# Patient Record
Sex: Male | Born: 1985 | Race: White | Hispanic: No | Marital: Married | Smoking: Never smoker
Health system: Southern US, Community
[De-identification: ages and names within clinical notes are randomized; demographics above are authoritative.]

## PROBLEM LIST (undated history)

## (undated) DIAGNOSIS — M459 Ankylosing spondylitis of unspecified sites in spine: Secondary | ICD-10-CM

## (undated) HISTORY — PX: OTHER SURGICAL HISTORY: SHX169

## (undated) HISTORY — PX: REPAIR OF SYNDACTYLY HAND: SUR1196

---

## 2017-08-25 ENCOUNTER — Encounter: Payer: Self-pay | Admitting: Emergency Medicine

## 2017-08-25 ENCOUNTER — Encounter (INDEPENDENT_AMBULATORY_CARE_PROVIDER_SITE_OTHER): Payer: Self-pay

## 2017-08-25 ENCOUNTER — Other Ambulatory Visit: Payer: Self-pay

## 2017-08-25 ENCOUNTER — Ambulatory Visit
Admission: EM | Admit: 2017-08-25 | Discharge: 2017-08-25 | Disposition: A | Discharge status: Home / Self Care | Payer: BLUE CROSS/BLUE SHIELD | Attending: Family Medicine | Admitting: Family Medicine

## 2017-08-25 ENCOUNTER — Ambulatory Visit (INDEPENDENT_AMBULATORY_CARE_PROVIDER_SITE_OTHER): Payer: BLUE CROSS/BLUE SHIELD

## 2017-08-25 DIAGNOSIS — Y9361 Activity, american tackle football: Secondary | ICD-10-CM | POA: Diagnosis not present

## 2017-08-25 DIAGNOSIS — W19XXXA Unspecified fall, initial encounter: Secondary | ICD-10-CM | POA: Diagnosis not present

## 2017-08-25 DIAGNOSIS — M545 Low back pain: Secondary | ICD-10-CM

## 2017-08-25 DIAGNOSIS — M461 Sacroiliitis, not elsewhere classified: Secondary | ICD-10-CM

## 2017-08-25 DIAGNOSIS — S300XXA Contusion of lower back and pelvis, initial encounter: Secondary | ICD-10-CM | POA: Diagnosis not present

## 2017-08-25 HISTORY — DX: Ankylosing spondylitis of unspecified sites in spine: M45.9

## 2017-08-25 MED ORDER — HYDROCODONE-ACETAMINOPHEN 5-325 MG PO TABS: ORAL_TABLET | ORAL | 0 refills | Status: AC

## 2017-08-25 NOTE — ED Triage Notes (Signed)
Patient c/o falling 2 days ago while playing football and landed on his back. He stated this morning he is having low back pain and tailbone pain.

## 2017-08-25 NOTE — ED Provider Notes (Signed)
[facility]    CSN: [id number] Arrival date & time: 08/25/17  1511     History   Chief Complaint Chief Complaint  Patient presents with  . Back Pain    HPI Worth Chanson Teems is a 32 y.o. male.   32 yo male with a c/o low back and tailbone pain after falling 2 days and landing on his low back and sacrum while playing football. Denies any fevers, chills, numbness/tingling.   The history is provided by the patient.  Back Pain    Past Medical History:  Diagnosis Date  . Ankylosing spondylitis (HCC)     There are no active problems to display for this patient.   Past Surgical History:  Procedure Laterality Date  . leg amputated    . REPAIR OF SYNDACTYLY HAND         Home Medications    Prior to Admission medications   Medication Sig Start Date End Date Taking? Authorizing Provider  celecoxib (CELEBREX) 100 MG capsule Take 100 mg by mouth 2 (two) times daily.   Yes [provider]  HYDROcodone-acetaminophen (NORCO/VICODIN) 5-325 MG tablet 1-2 tabs po qd prn 08/25/17   Payton Mccallum, MD    Family History Family History  Problem Relation Age of Onset  . Diabetes Mother   . Hypertension Mother   . Healthy Father     Social History Social History   Tobacco Use  . Smoking status: Never Smoker  . Smokeless tobacco: Never Used  Substance Use Topics  . Alcohol use: Yes    Comment: socially  . Drug use: Never     Allergies   Patient has no known allergies.   Review of Systems Review of Systems  Musculoskeletal: Positive for back pain.     Physical Exam Triage Vital Signs ED Triage Vitals  Enc Vitals Group     BP 08/25/17 1531 123/79     Pulse Rate 08/25/17 1531 77     Resp 08/25/17 1531 16     Temp 08/25/17 1531 98 F (36.7 C)     Temp Source 08/25/17 1531 Oral     SpO2 08/25/17 1531 98 %     Weight 08/25/17 1528 240 lb (108.9 kg)     Height 08/25/17 1528 5\' 5"  (1.651 m)     Head Circumference --      Peak Flow  --      Pain Score 08/25/17 1528 8     Pain Loc --      Pain Edu? --      Excl. in GC? --    No data found.  Updated Vital Signs BP 123/79 (BP Location: Left Arm)   Pulse 77   Temp 98 F (36.7 C) (Oral)   Resp 16   Ht 5\' 5"  (1.651 m)   Wt 240 lb (108.9 kg)   SpO2 98%   BMI 39.94 kg/m   Visual Acuity Right Eye Distance:   Left Eye Distance:   Bilateral Distance:    Right Eye Near:   Left Eye Near:    Bilateral Near:     Physical Exam  Constitutional: He appears well-developed and well-nourished. No distress.  Neck: Normal range of motion. Neck supple. No tracheal deviation present.  Pulmonary/Chest: Effort normal. No stridor. No respiratory distress.  Musculoskeletal:       Lumbar back: He exhibits tenderness, bony tenderness (lumbar sacral) and spasm. He exhibits normal range of motion, no swelling, no edema, no deformity, no laceration,  no pain and normal pulse.  Neurological: He is alert. He has normal reflexes. He displays normal reflexes. He exhibits normal muscle tone. Coordination normal.  Skin: No rash noted. He is not diaphoretic.  Nursing note and vitals reviewed.    UC Treatments / Results  Labs (all labs ordered are listed, but only abnormal results are displayed) Labs Reviewed - No data to display  EKG None  Radiology Dg Lumbar Spine Complete  Result Date: 08/25/2017 CLINICAL DATA:  Fall 2 days ago.  Low back pain EXAM: LUMBAR SPINE - COMPLETE 4+ VIEW COMPARISON:  None. FINDINGS: There is no evidence of lumbar spine fracture. Alignment is normal. Intervertebral disc spaces are maintained. IMPRESSION: Negative. Electronically Signed   By: Charlett NoseKevin  Dover M.D.   On: 08/25/2017 16:14   Dg Sacrum/coccyx  Result Date: 08/25/2017 CLINICAL DATA:  Fall 2 days ago.  Low back pain EXAM: SACRUM AND COCCYX - 2+ VIEW COMPARISON:  None. FINDINGS: Sclerosis around the SI joints bilaterally compatible with sacroiliitis. No evidence of sacral or coccygeal fracture. Hip  joints are maintained and symmetric. IMPRESSION: Sclerosis around the SI joints compatible with sacroiliitis. No acute bony abnormality. Electronically Signed   By: Charlett NoseKevin  Dover M.D.   On: 08/25/2017 16:14    Procedures Procedures (including critical care time)  Medications Ordered in UC Medications - No data to display  Initial Impression / Assessment and Plan / UC Course  I have reviewed the triage vital signs and the nursing notes.  Pertinent labs & imaging results that were available during my care of the patient were reviewed by me and considered in my medical decision making (see chart for details).      Final Clinical Impressions(s) / UC Diagnoses   Final diagnoses:  Lumbar contusion, initial encounter  Contusion of sacrum, initial encounter   Discharge Instructions   None    ED Prescriptions    Medication Sig Dispense Auth. Provider   HYDROcodone-acetaminophen (NORCO/VICODIN) 5-325 MG tablet 1-2 tabs po qd prn 6 tablet Homar Weinkauf, Pamala Hurryrlando, MD     1. x-ray results and diagnosis reviewed with patient 2. rx as per orders above; reviewed possible side effects, interactions, risks and benefits  3. Recommend supportive treatment with otc analgesics, rest, ice 4. Follow-up prn if symptoms worsen or don't improve  Controlled Substance Prescriptions [place] Controlled Substance Registry consulted? Not Applicable   Payton Mccallumonty, Dayln Tugwell, MD 08/25/17 [phone]

## 2019-04-07 IMAGING — CR DG LUMBAR SPINE COMPLETE 4+V
4 series · 4 of 4 positions shown · non-contrast
Comparison: None.

CLINICAL DATA: Fall 2 days ago.  Low back pain

EXAM:
LUMBAR SPINE - COMPLETE 4+ VIEW

[l-spine ap]
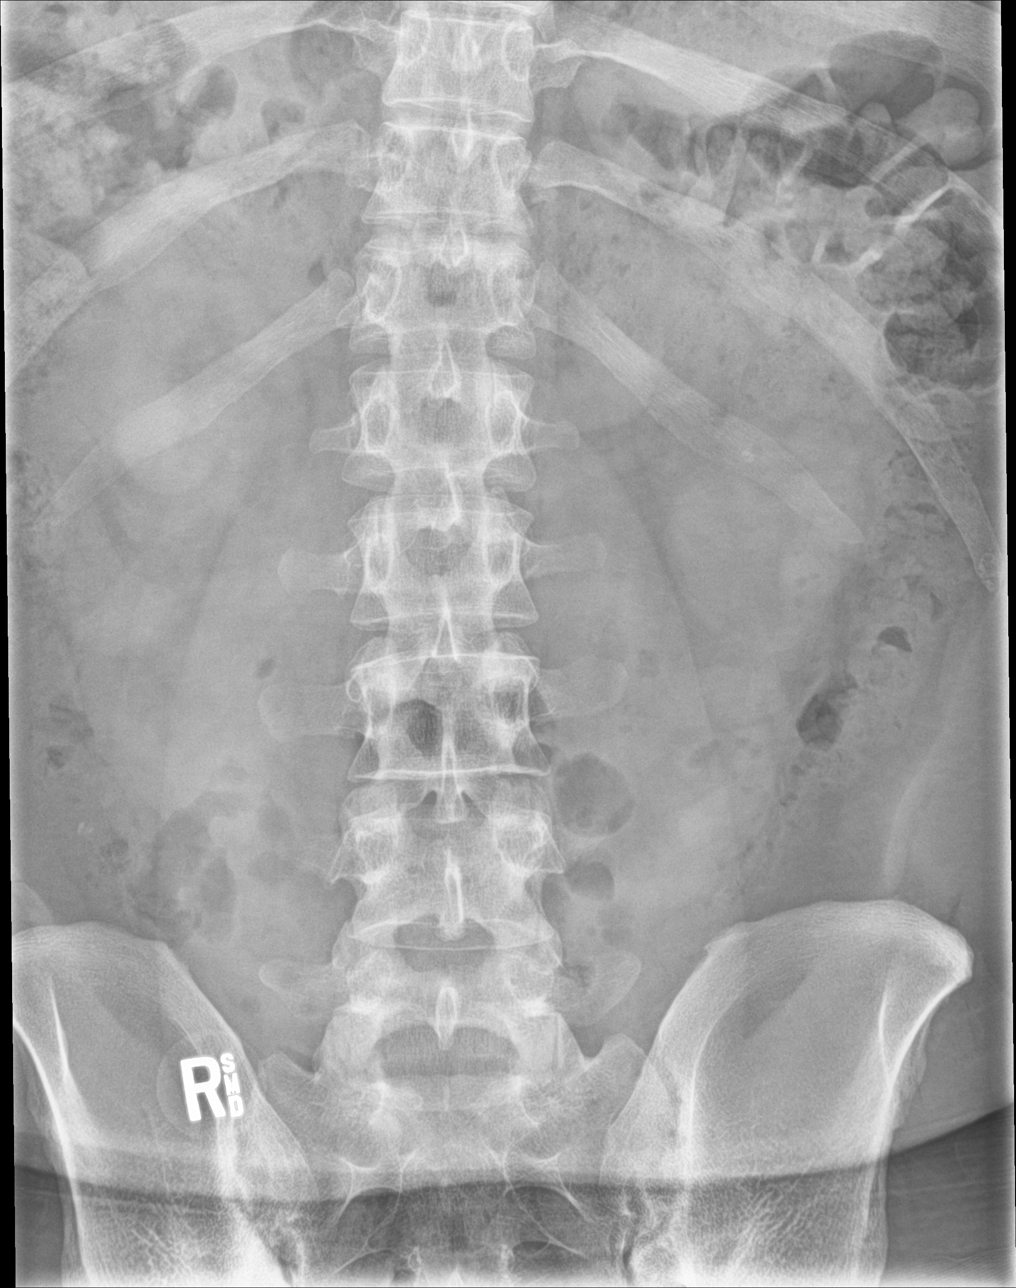

[l-spine obl (1 of 2)]
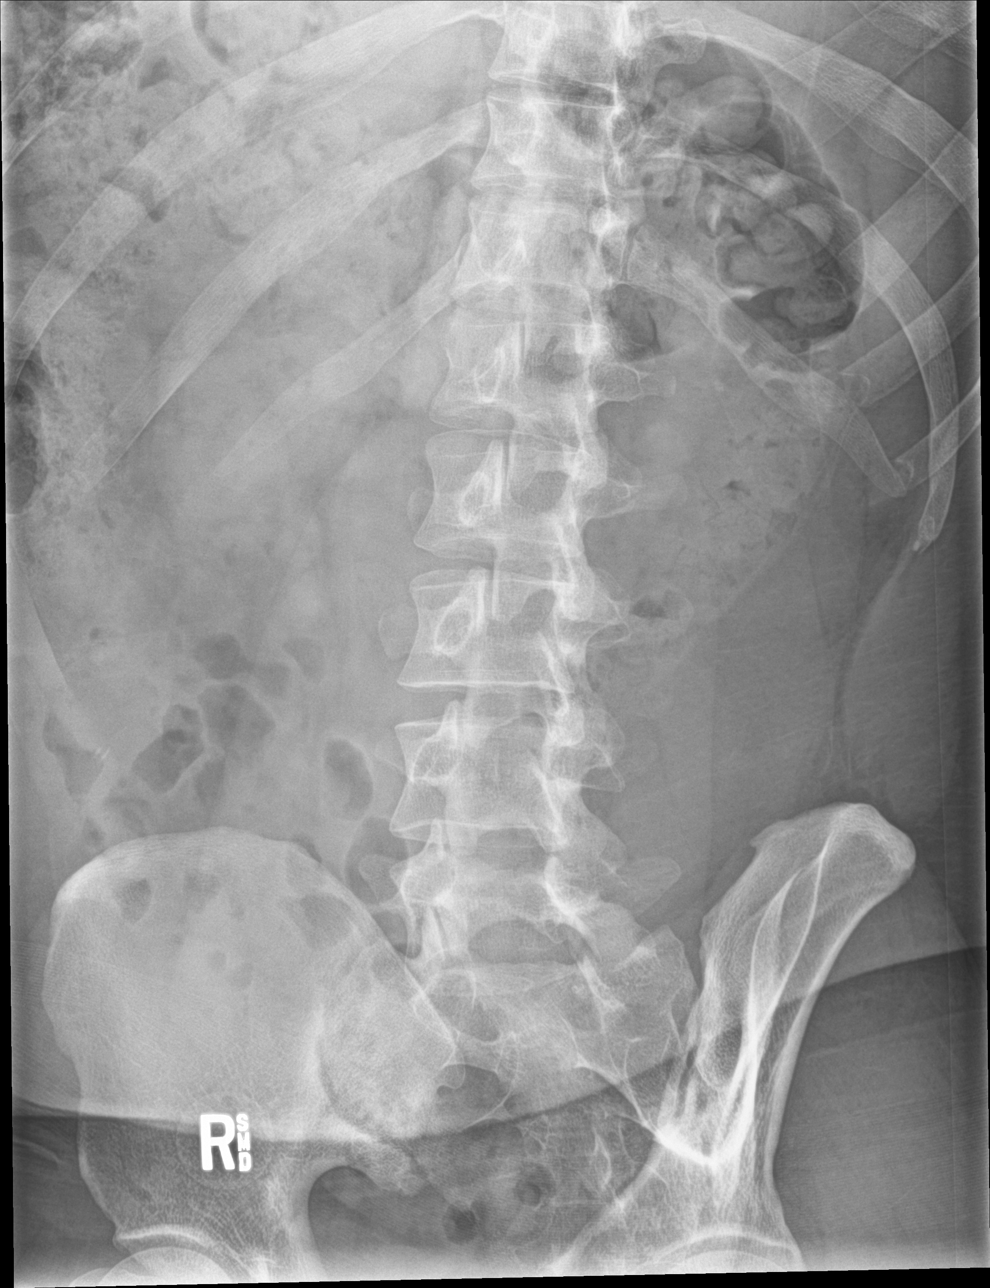

[l-spine obl (2 of 2)]
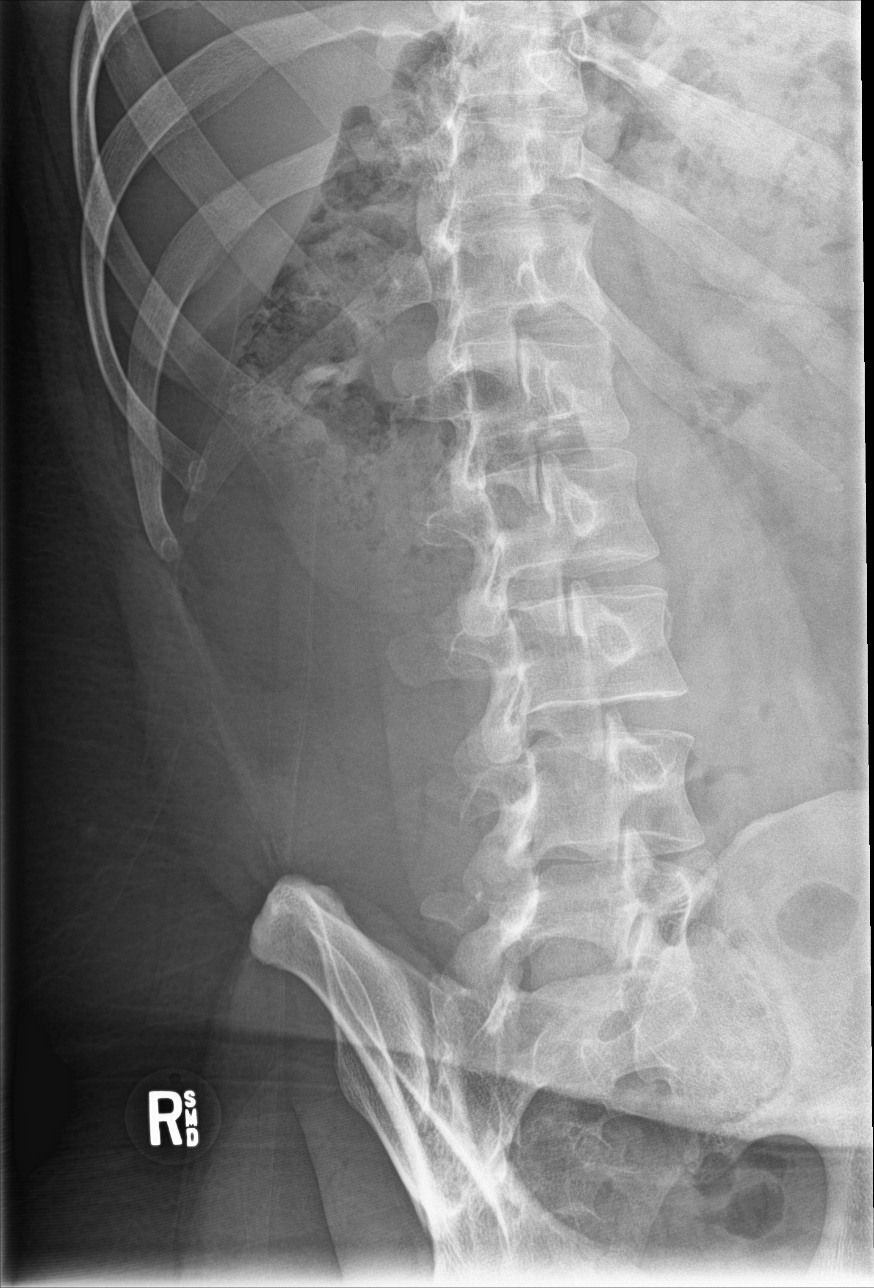

[l-spine lat]
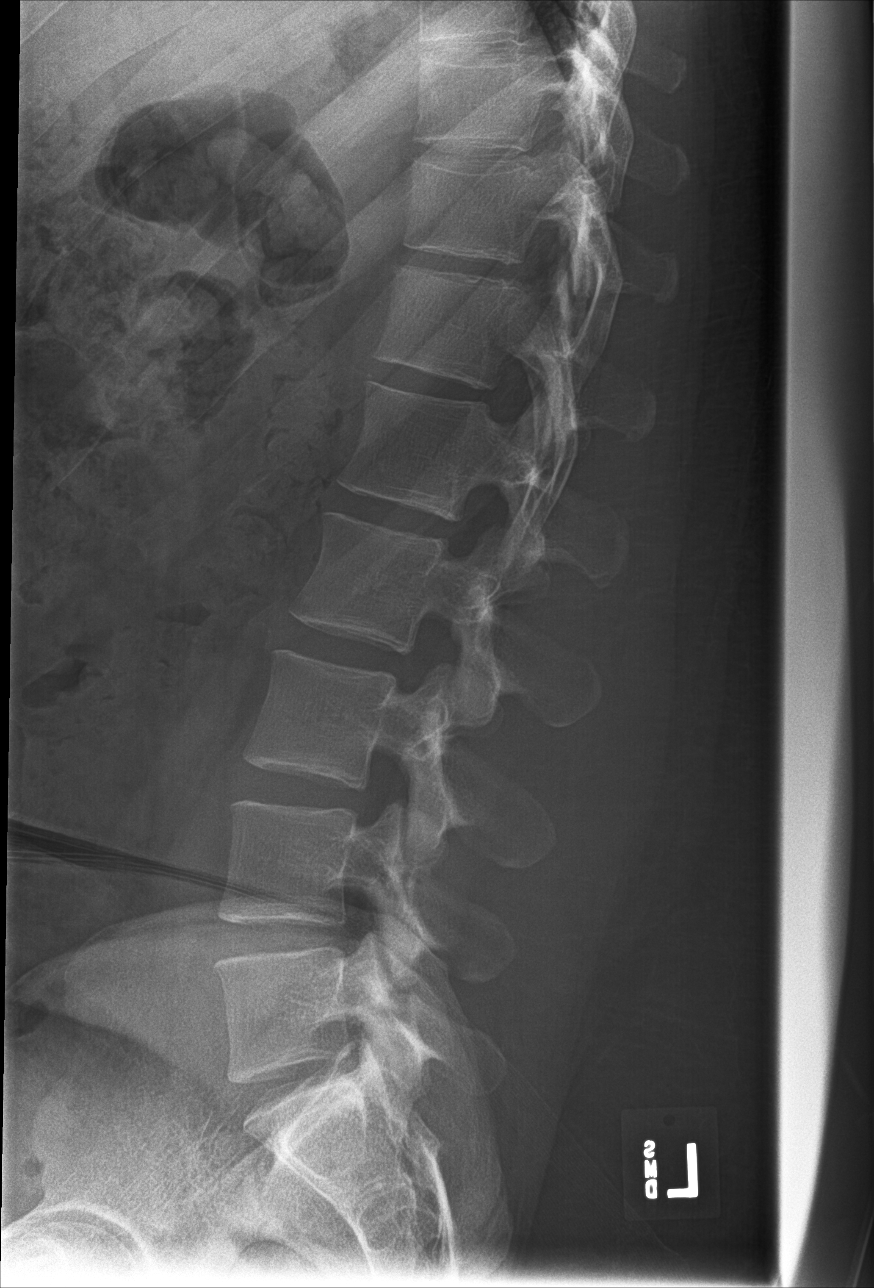

[4 of 4 positions shown; findings below may reference images not displayed]

FINDINGS: There is no evidence of lumbar spine fracture. Alignment is normal.
Intervertebral disc spaces are maintained.
IMPRESSION: Negative.
